# Patient Record
Sex: Male | Born: 2000
Health system: Southern US, Community
[De-identification: ages and names within clinical notes are randomized; demographics above are authoritative.]

---
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
## 2023-03-09 ENCOUNTER — Other Ambulatory Visit
Admission: RE | Admit: 2023-03-09 | Discharge: 2023-03-09 | Disposition: A | Discharge status: Home / Self Care | Payer: Self-pay | Source: Ambulatory Visit | Attending: Emergency Medicine | Admitting: Emergency Medicine

## 2023-03-09 DIAGNOSIS — Z03818 Encounter for observation for suspected exposure to other biological agents ruled out: Secondary | ICD-10-CM | POA: Diagnosis present

## 2023-03-09 LAB — COMPREHENSIVE METABOLIC PANEL
ALT: 45 U/L — ABNORMAL HIGH (ref 0–44)
AST: 84 U/L — ABNORMAL HIGH (ref 15–41)
Albumin: 4.8 g/dL (ref 3.5–5.0)
Alkaline Phosphatase: 34 U/L — ABNORMAL LOW (ref 38–126)
Anion gap: 10 (ref 5–15)
BUN: 12 mg/dL (ref 6–20)
CO2: 29 mmol/L (ref 22–32)
Calcium: 9.7 mg/dL (ref 8.9–10.3)
Chloride: 100 mmol/L (ref 98–111)
Creatinine, Ser: 0.94 mg/dL (ref 0.61–1.24)
GFR, Estimated: >60 mL/min (ref >60)
Glucose, Bld: 96 mg/dL (ref 70–99)
Potassium: 3.9 mmol/L (ref 3.5–5.1)
Sodium: 139 mmol/L (ref 135–145)
Total Bilirubin: 0.9 mg/dL (ref 0.0–1.2)
Total Protein: 7.7 g/dL (ref 6.5–8.1)
